# Patient Record
Sex: Male | Born: 1985 | Hispanic: Yes | Marital: Single | Smoking: Never smoker
Health system: Southern US, Community
[De-identification: ages and names within clinical notes are randomized; demographics above are authoritative.]

---

## 2014-05-10 ENCOUNTER — Emergency Department (HOSPITAL_COMMUNITY)
Admission: EM | Admit: 2014-05-10 | Discharge: 2014-05-10 | Disposition: A | Discharge status: Home / Self Care | Payer: Self-pay | Attending: Emergency Medicine | Admitting: Emergency Medicine

## 2014-05-10 ENCOUNTER — Emergency Department (HOSPITAL_COMMUNITY): Payer: Self-pay

## 2014-05-10 ENCOUNTER — Encounter (HOSPITAL_COMMUNITY): Payer: Self-pay | Admitting: Emergency Medicine

## 2014-05-10 DIAGNOSIS — Y9239 Other specified sports and athletic area as the place of occurrence of the external cause: Secondary | ICD-10-CM | POA: Insufficient documentation

## 2014-05-10 DIAGNOSIS — Y92838 Other recreation area as the place of occurrence of the external cause: Secondary | ICD-10-CM

## 2014-05-10 DIAGNOSIS — S93402A Sprain of unspecified ligament of left ankle, initial encounter: Secondary | ICD-10-CM

## 2014-05-10 DIAGNOSIS — R296 Repeated falls: Secondary | ICD-10-CM | POA: Insufficient documentation

## 2014-05-10 DIAGNOSIS — Y9366 Activity, soccer: Secondary | ICD-10-CM | POA: Insufficient documentation

## 2014-05-10 DIAGNOSIS — S93409A Sprain of unspecified ligament of unspecified ankle, initial encounter: Secondary | ICD-10-CM | POA: Insufficient documentation

## 2014-05-10 MED ORDER — NAPROXEN 500 MG PO TABS: 500.0000 mg | ORAL_TABLET | Freq: Two times a day (BID) | ORAL | Status: DC

## 2014-05-10 NOTE — ED Provider Notes (Signed)
CSN: [id number]     Arrival date & time 05/10/14  0120 History   First MD Initiated Contact with Patient 05/10/14 [phone]     Chief Complaint  Patient presents with  . Ankle Injury   HPI  History provided by the patient through a Spanish interpreter. Patient is a 28 year old male with no significant PMH presenting with complaints of left ankle and foot injury and pain. Patient reports playing a late-night soccer match and was going to kick the soccer ball with the inside of his left foot. After this he began having pain to the area of his left medial ankle. There is also swelling. He has had increased pain with walking and putting pressure on the area. Denies any prior history of ankle injuries. No weakness or numbness in the foot. No other complaints.   History reviewed. No pertinent past medical history. History reviewed. No pertinent past surgical history. Family History  Problem Relation Age of Onset  . Hypertension Other    History  Substance Use Topics  . Smoking status: Never Smoker   . Smokeless tobacco: Not on file  . Alcohol Use: Yes     Comment: occ    Review of Systems  Neurological: Negative for weakness and numbness.  All other systems reviewed and are negative.     Allergies  Review of patient's allergies indicates no known allergies.  Home Medications   Prior to Admission medications   Not on File   BP 123/75  Pulse 77  Temp(Src) 99.2 F (37.3 C) (Oral)  Resp 20  SpO2 98% Physical Exam  Nursing note and vitals reviewed. Constitutional: He is oriented to person, place, and time. He appears well-developed and well-nourished. No distress.  HENT:  Head: Normocephalic.  Cardiovascular: Normal rate and regular rhythm.   Pulmonary/Chest: Effort normal and breath sounds normal. No respiratory distress. He has no wheezes. He has no rales.  Musculoskeletal: He exhibits edema and tenderness.  Slightly reduced range of motion in the left ankle secondary to pain.  There is swelling and focal tenderness to the medial left ankle just below the malleolus. There is no significant pain over the medial malleolus or any deformity. No pain over the lateral malleolus, proximal fifth metatarsal or midfoot. Normal course pulses. Normal sensation to the toes.  Neurological: He is alert and oriented to person, place, and time.  Skin: Skin is warm. No rash noted. No erythema.  Psychiatric: He has a normal mood and affect. His behavior is normal.    ED Course  Procedures   COORDINATION OF CARE:  Nursing notes reviewed. Vital signs reviewed. Initial pt interview and examination performed.   Filed Vitals:   05/10/14 0132  BP: 123/75  Pulse: 77  Temp: 99.2 F (37.3 C)  TempSrc: Oral  Resp: 20  SpO2: 98%    4:24 AM-patient seen and evaluated. He appears well no acute distress. X-rays reviewed. No signs of fracture. Findings consistent with deltoid ligament ankle sprain.    Imaging Review Dg Ankle Complete Left  05/10/2014   CLINICAL DATA:  Injury, medial ankle pain and swelling.  EXAM: LEFT ANKLE COMPLETE - 3+ VIEW  COMPARISON:  None.  FINDINGS: There is no evidence of fracture, dislocation, or joint effusion. There is no evidence of arthropathy or other focal bone abnormality. Mild soft tissue swelling without subcutaneous gas or radiopaque foreign bodies.  IMPRESSION: No acute fracture deformity or dislocation.   Electronically Signed   By: Awilda Metroourtnay  Bloomer   On: 05/10/2014  02:46     MDM   Final diagnoses:  Ankle sprain, left, initial encounter        Angus Sellereter S Dammen, PA-C 05/10/14 0451

## 2014-05-10 NOTE — ED Provider Notes (Signed)
Medical screening examination/treatment/procedure(s) were performed by non-physician practitioner and as supervising physician I was immediately available for consultation/collaboration.   EKG Interpretation None       Olga M Otter, MD 05/10/14 0610 

## 2014-05-10 NOTE — Discharge Instructions (Signed)
X-ray did not show any broken bones. At this time your providers feel you have a sprained ankle. Use rest, ice, compression and elevation to reduce pain and swelling.    Esguince de tobillo (Ankle Sprain)  Un esguince de tobillo es una lesin en los tejidos fuertes y fibrosos (ligamentos) que mantienen unidos los huesos de la articulacin del tobillo.  CAUSAS  Las causas pueden ser una cada o la torcedura del tobillo. Los esguinces de tobillo ocurren con ms frecuencia al pisar con el borde exterior del pie, lo que hace que el tobillo se vuelva hacia adentro. Las personas que practican deportes son ms propensas a este tipo de lesiones.  SNTOMAS   Dolor en el tobillo. El dolor puede aparecer durante el reposo o slo al tratar de ponerse de pie o caminar.  Hinchazn.  Hematomas. Los hematomas pueden aparecer inmediatamente o luego de 1 a 2 das despus de la lesin.  Dificultad para pararse o caminar, especialmente al doblar en esquinas o al cambiar de direccin. DIAGNSTICO  El mdico le preguntar detalles acerca de la lesin y le har un examen fsico del tobillo para determinar si tiene un esguince. Durante el examen fsico, el mdico apretar y Contractoraplicar presin en reas especficas del pie y del tobillo. El mdico tratar de Licensed conveyancermover el tobillo en ciertas direcciones. Le indicarn una radiografa para descartar la fractura de un hueso o que un ligamento no se haya separado de uno de los huesos del tobillo (fractura por avulsin).  TRATAMIENTO  Algunos tipos de soporte podrn ayudarlo a estabilizar el tobillo. El profesional que lo asiste le dar las indicaciones. Tambin podr indicarle que use medicamentos para Primary school teachercalmar el dolor. Si el esguince es grave, su mdico podr derivarlo a un cirujano que lo ayudar a Psychologist, occupationalrecuperar la funcin de las partes afectadas del sistema esqueltico (ortopedista) o a un fisioterapeuta.  INSTRUCCIONES PARA EL CUIDADO EN EL HOGAR   Aplique hielo en la articulacin  lesionada durante 1  2 das o segn lo que le indique su mdico. La aplicacin del hielo ayuda a reducir la inflamacin y Chief Technology Officerel dolor.  Ponga el hielo en una bolsa plstica.  Colquese una toalla entre la piel y la bolsa de hielo.  Deje el hielo en el lugar durante 15 a 20 minutos por vez, cada 2 horas mientras est despierto.  Slo tome medicamentos de venta libre o recetados para Primary school teachercalmar el dolor, las molestias o bajar la fiebre segn las indicaciones de su mdico.  Eleve el tobillo lesionado por encima del nivel del corazn tanto como pueda durante 2 o 3 das.  Si su mdico le indica el uso de [place]as, selas segn las instrucciones. Gradualmente lleve el peso sobre el tobillo Millersburgafectado. Siga usando muletas o un bastn hasta que pueda caminar sin sentir dolor en el tobillo.  Si tiene una frula de yeso, sela como lo indique su mdico. No se apoye en ninguna cosa ms dura que una Eaton Corporationalmohada durante las primeras 24 horas. No ponga peso sobre la frula. No permita que se moje. Puede quitrsela para tomar una ducha o un bao.  Pueden haberle colocado un vendaje elstico para usar alrededor del tobillo para darle soporte. Si el vendaje elstico est muy ajustado (siente adormecimiento u hormigueo o el pie est fro y [place]azul), ajstelo para que sea ms cmodo.  Si usted tiene una frula de [place], puede soplar o dejar salir el aire para que sea ms cmodo. Puede quitarse la frula por la noche  y antes de tomar una ducha o un bao. Mueva los dedos de los pies en la frula varias veces al da para disminuir la hinchazn. SOLICITE ATENCIN MDICA SI:   Le aumenta rpidamente el moretn o el hinchazn.  Los dedos de los pies estn extremadamente fros o pierde la sensibilidad en el pie.  El dolor no se alivia con los United Parcelmedicamentos. SOLICITE ATENCIN MDICA DE INMEDIATO SI:   Los dedos de los pies estn adormecidos o de Edison Internationalcolor azul.  Tiene un dolor agudo que va aumentando. ASEGRESE DE QUE:   Comprende  estas instrucciones.  Controlar su enfermedad.  Solicitar ayuda de inmediato si no mejora o empeora. Document Released: 10/29/2005 Document Revised: 07/23/2012 [facility] Patient Information 2015 [place], [place]. This information is not intended to replace advice given to you by your health care provider. Make sure you discuss any questions you have with your health care provider.

## 2014-05-10 NOTE — ED Notes (Signed)
Patient transported to X-ray 

## 2014-05-10 NOTE — ED Notes (Signed)
Visitor states they had a soccer game tonight and when the pt was leaving the field he fell  Unknown if he hurt his ankle during the game  Pt is c/o pain to his left ankle

## 2014-07-16 ENCOUNTER — Encounter (HOSPITAL_COMMUNITY): Payer: Self-pay | Admitting: Emergency Medicine

## 2014-07-16 ENCOUNTER — Emergency Department (HOSPITAL_COMMUNITY)
Admission: EM | Admit: 2014-07-16 | Discharge: 2014-07-16 | Disposition: A | Discharge status: Home / Self Care | Payer: Self-pay | Attending: Emergency Medicine | Admitting: Emergency Medicine

## 2014-07-16 DIAGNOSIS — R51 Headache: Secondary | ICD-10-CM | POA: Insufficient documentation

## 2014-07-16 DIAGNOSIS — Z791 Long term (current) use of non-steroidal anti-inflammatories (NSAID): Secondary | ICD-10-CM | POA: Insufficient documentation

## 2014-07-16 DIAGNOSIS — F411 Generalized anxiety disorder: Secondary | ICD-10-CM | POA: Insufficient documentation

## 2014-07-16 DIAGNOSIS — F419 Anxiety disorder, unspecified: Secondary | ICD-10-CM

## 2014-07-16 MED ORDER — LORAZEPAM 1 MG PO TABS: 1.0000 mg | ORAL_TABLET | Freq: Three times a day (TID) | ORAL | Status: AC | PRN

## 2014-07-16 NOTE — Discharge Instructions (Signed)
Trastorno de ansiedad generalizada (Generalized Anxiety Disorder) El trastorno de ansiedad generalizada es un trastorno mental. Interfiere en las funciones vitales, incluyendo las relaciones, el trabajo y la escuela.  Es diferente de la ansiedad normal que todas las personas experimentan en algn momento de su vida en respuesta a sucesos y actividades especficas. En verdad, la ansiedad normal nos ayuda a prepararnos y atravesar estos acontecimientos y actividades de la vida. La ansiedad normal desaparece despus de que el evento o la actividad ha finalizado.  El trastorno de ansiedad generalizada no est necesariamente relacionada con eventos o actividades especficas. Tambin causa un exceso de ansiedad en proporcin a sucesos o actividades especficas. En este trastorno la ansiedad es difcil de controlar. Los sntomas pueden variar de leves a muy graves. Las personas que sufren de trastorno de ansiedad generalizada pueden tener intensas olas de ansiedad con sntomas fsicos (ataques de pnico).  SNTOMAS  La ansiedad y la preocupacin asociada a este trastorno son difciles de controlar. Esta ansiedad y la preocupacin estn relacionados con muchos eventos de la vida y sus actividades y tambin ocurre durante ms das de los que no ocurre, durante 6 meses o ms. Las personas que la sufren pueden tener tres o ms de los siguientes sntomas (uno o ms en los nios):   Agitacin   Fatiga.  Dificultades de concentracin.   Irritabilidad.  Tensin muscular  Dificultad para dormirse o sueo poco satisfactorio. DIAGNSTICO  Se diagnostica a travs de una evaluacin realizada por el mdico. El mdico le har preguntas acerca de su estado de nimo, sntomas fsicos y sucesos de su vida. Le har preguntas sobre su historia clnica, el consumo de alcohol o drogas, incluyendo los medicamentos recetados. Tambin le har un examen fsico e indicar anlisis de sangre. Ciertas enfermedades y el uso de  determinadas sustancias pueden causar sntomas similares a este trastorno. Su mdico lo puede derivar a un especialista en salud mental para una evaluacin ms profunda..  TRATAMIENTO  Las terapias siguientes se utilizan en el tratamiento de este trastorno:   Medicamentos - Se recetan antidepresivos para el control diario a largo plazo. Pueden indicarse tambin medicamentos para combatir la ansiedad en los casos graves, especialmente cuando ocurren ataques de pnico.   Terapia conversada (psicoterapia) Ciertos tipos de psicoterapia pueden ser tiles en el tratamiento del trastorno de ansiedad generalizada, proporcionando apoyo, educacin y orientacin. Una forma de psicoterapia llamada terapia cognitivo-conductual puede ensearle formas saludables de pensar y reaccionar a los eventos y actividades de la vida diaria.  Tcnicasde manejo del estrs- Estas tcnicas incluyen el yoga, la meditacin y el ejercicio y pueden ser muy tiles cuando se practican con regularidad. Un especialista en salud mental puede ayudar a determinar qu tratamiento es mejor para usted. Algunas personas obtienen mejora con una terapia. Sin embargo, otras personas requieren una combinacin de terapias.  Document Released: 02/23/2013 Document Revised: 03/15/2014 ExitCare Patient Information 2015 ExitCare, LLC. This information is not intended to replace advice given to you by your health care provider. Make sure you discuss any questions you have with your health care provider.  

## 2014-07-16 NOTE — ED Provider Notes (Signed)
CSN: [id number]     Arrival date & time 07/16/14  1043 History   First MD Initiated Contact with Patient 07/16/14 1108     Chief Complaint  Patient presents with  . Headache  . Anxiety     (Consider location/radiation/quality/duration/timing/severity/associated sxs/prior Treatment) HPI Comments: Patient presents to the emergency department with chief complaint of anxiety. After using Spanish language interpreter, it is clear that the patient does not complain of headache or chills.  Rather, he states that he feels anxious from time to time.  He reports associated tingling in his extremities that move toward his head.  He denies fevers, chest pain, or abdominal pain.  He states that when he becomes very anxious he gets short of breath.  He states that he is completely asymptomatic now, but states that he experienced the symptoms this morning and wanted to get checked out.  The symptoms are aggravated when he has to go to work.  Nothing he knows of makes them better.  The history is provided by the patient. No language interpreter was used.    History reviewed. No pertinent past medical history. History reviewed. No pertinent past surgical history. Family History  Problem Relation Age of Onset  . Hypertension Other    History  Substance Use Topics  . Smoking status: Never Smoker   . Smokeless tobacco: Not on file  . Alcohol Use: Yes     Comment: occ    Review of Systems  Constitutional: Negative for fever and chills.  Respiratory: Negative for shortness of breath.   Cardiovascular: Negative for chest pain.  Gastrointestinal: Negative for nausea, vomiting, diarrhea and constipation.  Genitourinary: Negative for dysuria.  Psychiatric/Behavioral: The patient is nervous/anxious.       Allergies  Review of patient's allergies indicates no known allergies.  Home Medications   Prior to Admission medications   Medication Sig Start Date End Date Taking? Authorizing Provider   LORazepam (ATIVAN) 1 MG tablet Take 1 tablet (1 mg total) by mouth every 8 (eight) hours as needed for anxiety (As needed for anxiety). 07/16/14   Roxy Horseman, PA-C  naproxen (NAPROSYN) 500 MG tablet Take 1 tablet (500 mg total) by mouth 2 (two) times daily. 05/10/14   Phill Mutter Dammen, PA-C   BP 131/77  Pulse 78  Temp(Src) 98.6 F (37 C)  Resp 18  SpO2 99% Physical Exam  Nursing note and vitals reviewed. Constitutional: He is oriented to person, place, and time. He appears well-developed and well-nourished.  HENT:  Head: Normocephalic and atraumatic.  Eyes: Conjunctivae and EOM are normal. Pupils are equal, round, and reactive to light. Right eye exhibits no discharge. Left eye exhibits no discharge. No scleral icterus.  Neck: Normal range of motion. Neck supple. No JVD present.  Cardiovascular: Normal rate, regular rhythm and normal heart sounds.  Exam reveals no gallop and no friction rub.   No murmur heard. Pulmonary/Chest: Effort normal and breath sounds normal. No respiratory distress. He has no wheezes. He has no rales. He exhibits no tenderness.  Abdominal: Soft. He exhibits no distension and no mass. There is no tenderness. There is no rebound and no guarding.  Musculoskeletal: Normal range of motion. He exhibits no edema and no tenderness.  Neurological: He is alert and oriented to person, place, and time.  Skin: Skin is warm and dry.  Psychiatric: He has a normal mood and affect. His behavior is normal. Judgment and thought content normal.    ED Course  Procedures (including critical  care time) Labs Review Labs Reviewed - No data to display  Imaging Review No results found.   EKG Interpretation None      MDM   Final diagnoses:  Anxiety    Patient with what sounds like panic/anxiety attacks. States that intermittently, he feels very anxious, and has tingling in his extremities, as well as feelings of being short of breath. He states that the symptoms sometimes,  and he has to go to work. There are no alleviating factors that he is aware of. He is not having any symptoms at this time. States that he just wants to be checked out. Will give him a small prescription for Ativan, and reck and outpatient psychotherapy. Patient understands and agrees with plan. He is stable and ready for discharge.    Roxy Horseman, PA-C 07/16/14 1130

## 2014-07-16 NOTE — ED Notes (Signed)
Spanish only speaking pt presents to ed with c/o headache and chills for a while, denies n/v, photophobia. Also reports anxiety

## 2014-07-16 NOTE — Progress Notes (Signed)
[facility] [employer],  Did not get to speak with patient but will be sending information about [facility] to help patient establish primary care, using the address provided.

## 2014-07-16 NOTE — ED Provider Notes (Signed)
Medical screening examination/treatment/procedure(s) were performed by non-physician practitioner and as supervising physician I was immediately available for consultation/collaboration.    Jessiah Wojnar, MD 07/16/14 1636 

## 2016-01-05 IMAGING — CR DG ANKLE COMPLETE 3+V*L*
3 series · 3 of 3 positions shown · non-contrast
Comparison: None.

CLINICAL DATA: Injury, medial ankle pain and swelling.

EXAM:
LEFT ANKLE COMPLETE - 3+ VIEW

[x ankle ap left]
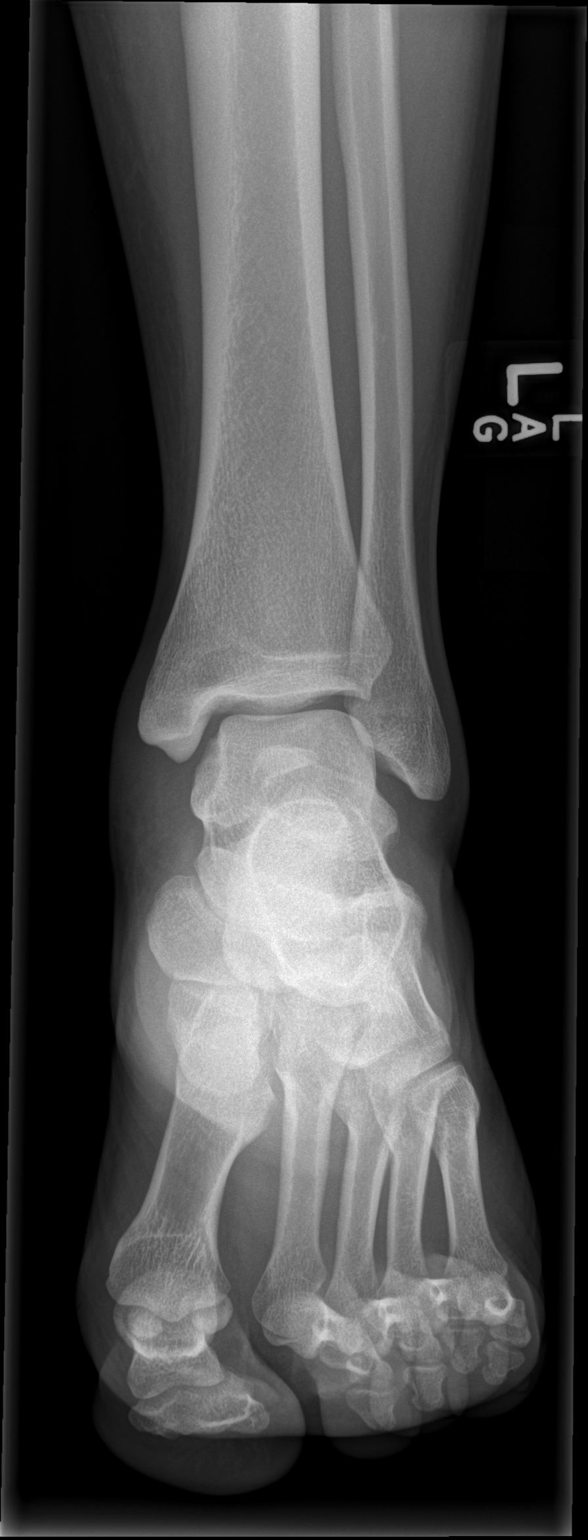

[x ankle obl left]
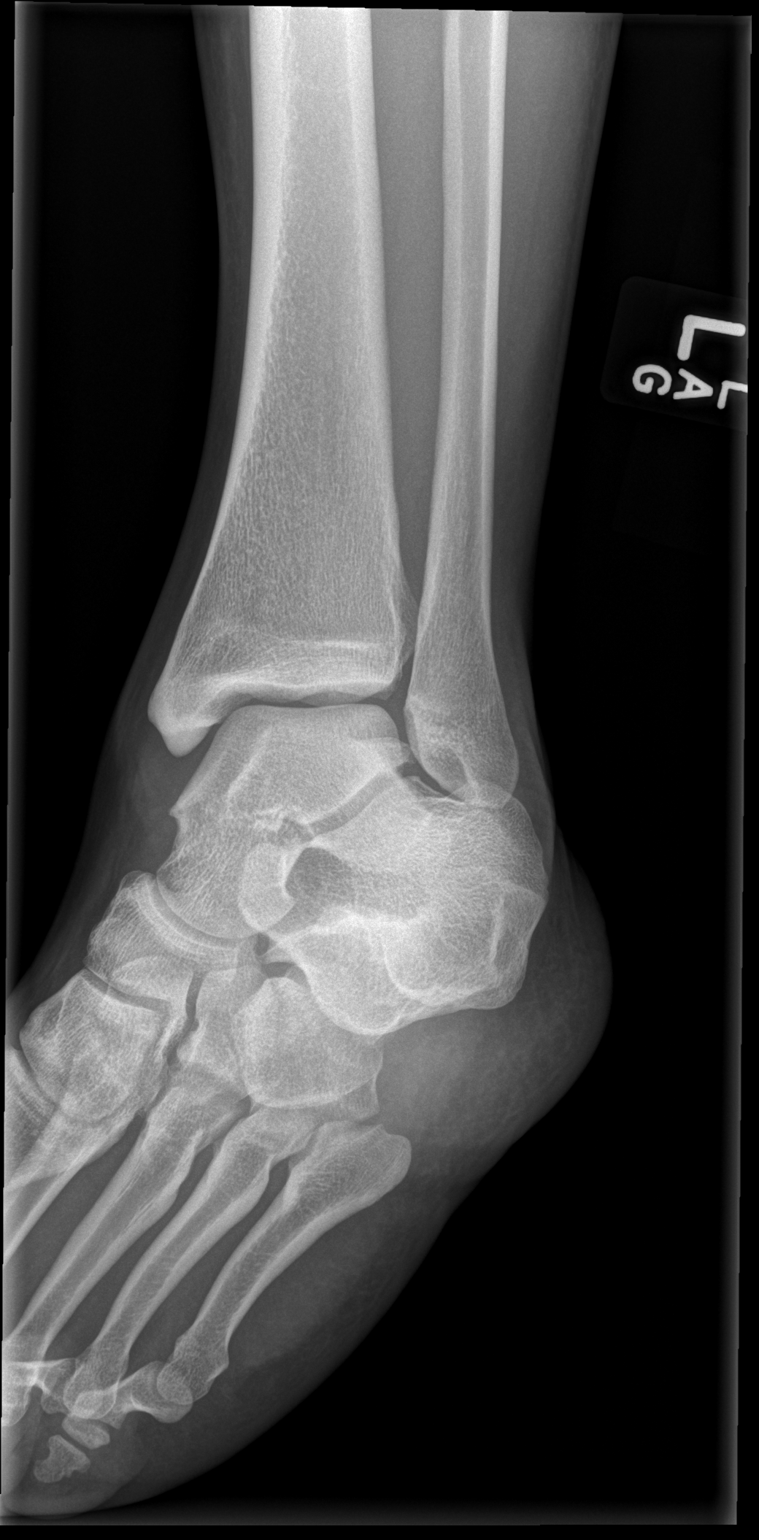

[x ankle lat left]
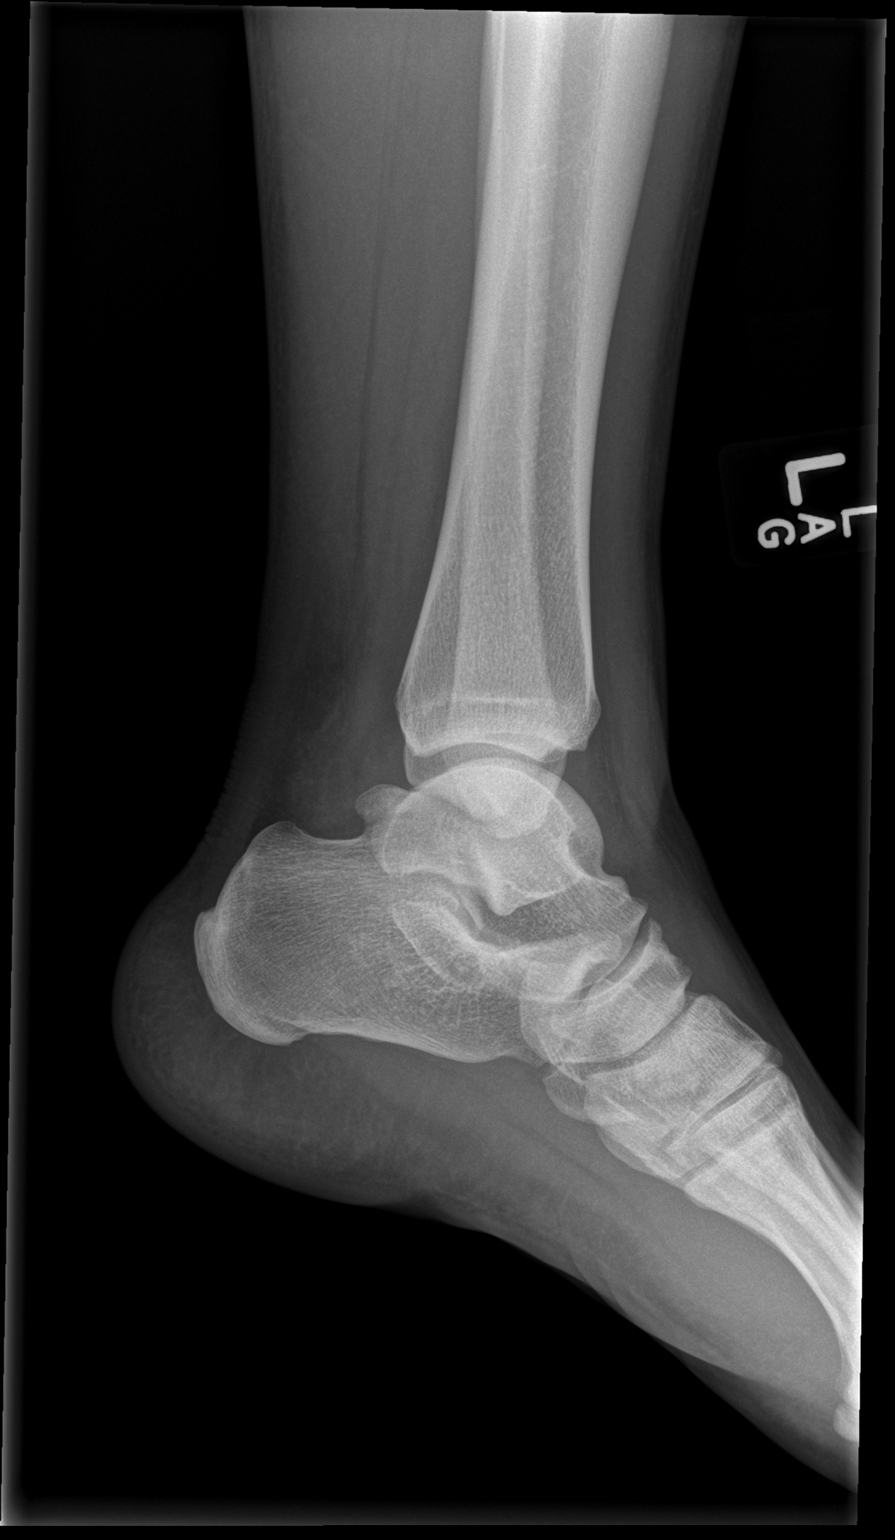

[3 of 3 positions shown; findings below may reference images not displayed]

FINDINGS: There is no evidence of fracture, dislocation, or joint effusion.
There is no evidence of arthropathy or other focal bone abnormality.
Mild soft tissue swelling without subcutaneous gas or radiopaque
foreign bodies.
IMPRESSION: No acute fracture deformity or dislocation.

  By: Gjorgji Matlas
